# Patient Record
Sex: Female | Born: 1980 | Race: Asian | Hispanic: No | Marital: Married | Smoking: Never smoker
Health system: Southern US, Community
[De-identification: ages and names within clinical notes are randomized; demographics above are authoritative.]

## PROBLEM LIST (undated history)

## (undated) DIAGNOSIS — Z789 Other specified health status: Secondary | ICD-10-CM

## (undated) HISTORY — PX: NO PAST SURGERIES: SHX2092

---

## 2005-11-15 ENCOUNTER — Other Ambulatory Visit: Admission: RE | Admit: 2005-11-15 | Discharge: 2005-11-15 | Payer: Self-pay | Admitting: Gynecology

## 2006-04-29 ENCOUNTER — Emergency Department (HOSPITAL_COMMUNITY): Admission: EM | Admit: 2006-04-29 | Discharge: 2006-04-29 | Payer: Self-pay | Admitting: Emergency Medicine

## 2006-04-30 ENCOUNTER — Emergency Department (HOSPITAL_COMMUNITY): Admission: EM | Admit: 2006-04-30 | Discharge: 2006-04-30 | Payer: Self-pay | Admitting: Emergency Medicine

## 2006-07-09 ENCOUNTER — Inpatient Hospital Stay (HOSPITAL_COMMUNITY): Admission: AD | Admit: 2006-07-09 | Discharge: 2006-07-12 | Payer: Self-pay | Admitting: Obstetrics & Gynecology

## 2007-09-29 ENCOUNTER — Inpatient Hospital Stay (HOSPITAL_COMMUNITY): Admission: AD | Admit: 2007-09-29 | Discharge: 2007-09-29 | Payer: Self-pay | Admitting: Obstetrics and Gynecology

## 2007-10-14 ENCOUNTER — Inpatient Hospital Stay (HOSPITAL_COMMUNITY): Admission: AD | Admit: 2007-10-14 | Discharge: 2007-10-16 | Payer: Self-pay | Admitting: Obstetrics and Gynecology

## 2010-05-14 ENCOUNTER — Ambulatory Visit (HOSPITAL_COMMUNITY)
Admission: AD | Admit: 2010-05-14 | Discharge: 2010-05-14 | Disposition: A | Discharge status: Home / Self Care | Payer: Self-pay | Source: Ambulatory Visit | Attending: Obstetrics and Gynecology | Admitting: Obstetrics and Gynecology

## 2010-05-14 ENCOUNTER — Other Ambulatory Visit: Payer: Self-pay | Admitting: Obstetrics and Gynecology

## 2010-05-14 DIAGNOSIS — O021 Missed abortion: Secondary | ICD-10-CM | POA: Insufficient documentation

## 2010-05-14 LAB — CBC
HCT: 40.9 % (ref 36.0–46.0)
Hemoglobin: 13.6 g/dL (ref 12.0–15.0)
MCV: 89.7 fL (ref 78.0–100.0)

## 2010-05-31 NOTE — Op Note (Signed)
  Anna Sims, DECOURSEY                 ACCOUNT NO.:  [id number]  MEDICAL RECORD NO.:  [id number]           PATIENT TYPE:  O  LOCATION:  [facility]                          [facility]  PHYSICIAN:  Carrington Clamp, M.D. DATE OF BIRTH:  01/12/1981  DATE OF PROCEDURE:  05/14/2010 DATE OF DISCHARGE:                              OPERATIVE REPORT   PREOPERATIVE DIAGNOSIS:  Missed abortion.  POSTOPERATIVE DIAGNOSIS:  Missed abortion.  PROCEDURE:  Dilation and evacuation.  SURGEON:  Carrington Clamp, M.D.  ANESTHESIA:  General LMA.  FINDINGS:  About a 7-week sized uterus down to 6-week size postop with good crie.  SPECIMENS:  Uterine contents to Pathology.  IV FLUIDS:  600 mL.  ESTIMATED BLOOD LOSS:  Minimal.  URINE OUTPUT:  Not measured.  COMPLICATIONS:  None.  TECHNIQUE:  After adequate LMA anesthesia was achieved, the patient was prepped and draped in usual sterile fashion in dorsal lithotomy position.  The bladder was emptied with a red rubber catheter and the speculum placed in the vagina.  Single-tooth tenaculum was placed in the cervix and the cervix dilated with Shawnie Pons dilators.  The 8 mm curette was then passed into the uterus and suction curettage was performed. Alternating suction and sharp curettage was then performed to ensure all products were removed and there was good crie.  Once this was achieved, all instruments were withdrawn from the vagina and the patient was given Methergine.  The patient was transferred to the recovery room in stable condition.    Carrington Clamp, M.D.    MH/MEDQ  D:  05/14/2010  T:  05/15/2010  Job:  [id number]  Electronically Signed by Carrington Clamp MD on 05/31/2010 08:24:02 AM

## 2010-12-23 LAB — URINALYSIS, ROUTINE W REFLEX MICROSCOPIC
Urobilinogen, UA: 0.2
pH: 7

## 2010-12-23 LAB — URINE MICROSCOPIC-ADD ON

## 2010-12-24 LAB — CBC
HCT: 31.6 — ABNORMAL LOW
HCT: 37.9
Hemoglobin: 10.7 — ABNORMAL LOW
MCV: 92
Platelets: 169
RBC: 4.12
RDW: 15.4
RDW: 15.6 — ABNORMAL HIGH

## 2011-03-30 ENCOUNTER — Ambulatory Visit: Payer: Self-pay

## 2011-03-30 DIAGNOSIS — J40 Bronchitis, not specified as acute or chronic: Secondary | ICD-10-CM

## 2011-03-30 DIAGNOSIS — G4700 Insomnia, unspecified: Secondary | ICD-10-CM

## 2011-04-04 ENCOUNTER — Ambulatory Visit (INDEPENDENT_AMBULATORY_CARE_PROVIDER_SITE_OTHER): Payer: Self-pay

## 2011-04-04 DIAGNOSIS — H66009 Acute suppurative otitis media without spontaneous rupture of ear drum, unspecified ear: Secondary | ICD-10-CM

## 2011-12-21 ENCOUNTER — Other Ambulatory Visit: Payer: Self-pay | Admitting: Obstetrics and Gynecology

## 2011-12-21 LAB — OB RESULTS CONSOLE GC/CHLAMYDIA
Chlamydia: NEGATIVE
Gonorrhea: NEGATIVE

## 2012-01-18 LAB — OB RESULTS CONSOLE HEPATITIS B SURFACE ANTIGEN: Hepatitis B Surface Ag: NEGATIVE

## 2012-01-18 LAB — OB RESULTS CONSOLE ANTIBODY SCREEN: Antibody Screen: NEGATIVE

## 2012-03-28 NOTE — L&D Delivery Note (Addendum)
Patient was [name] and pushed for 5 minutes with epidural.   NSVD  female infant, Apgars 9,9, weight P.   The patient had one midline episiotomy cut for terminal bradycardia repaired with 2-0 vicryl R. Fundus was firm. EBL was expected. Placenta was delivered intact. Vagina was clear.  Baby was vigorous to bedside.  Anna Sims

## 2012-07-15 LAB — OB RESULTS CONSOLE GBS: GBS: NEGATIVE

## 2012-08-07 ENCOUNTER — Observation Stay (HOSPITAL_COMMUNITY)
Admission: AD | Admit: 2012-08-07 | Discharge: 2012-08-07 | DRG: 382 | Disposition: A | Discharge status: Home / Self Care | Payer: BC Managed Care – PPO | Source: Ambulatory Visit | Attending: Obstetrics and Gynecology | Admitting: Obstetrics and Gynecology

## 2012-08-07 ENCOUNTER — Encounter (HOSPITAL_COMMUNITY): Payer: Self-pay | Admitting: *Deleted

## 2012-08-07 DIAGNOSIS — O479 False labor, unspecified: Principal | ICD-10-CM | POA: Diagnosis present

## 2012-08-07 HISTORY — DX: Other specified health status: Z78.9

## 2012-08-07 LAB — CBC
MCHC: 33.7 g/dL (ref 30.0–36.0)
Platelets: 169 10*3/uL (ref 150–400)
RDW: 14.3 % (ref 11.5–15.5)

## 2012-08-07 LAB — RPR: RPR Ser Ql: NONREACTIVE

## 2012-08-07 MED ORDER — LACTATED RINGERS IV SOLN: 500.0000 mL | INTRAVENOUS | Status: DC | PRN

## 2012-08-07 MED ORDER — PHENYLEPHRINE 40 MCG/ML (10ML) SYRINGE FOR IV PUSH (FOR BLOOD PRESSURE SUPPORT): 80.0000 ug | PREFILLED_SYRINGE | INTRAVENOUS | Status: DC | PRN

## 2012-08-07 MED ORDER — LACTATED RINGERS IV SOLN: INTRAVENOUS | Status: DC

## 2012-08-07 MED ORDER — CITRIC ACID-SODIUM CITRATE 334-500 MG/5ML PO SOLN: 30.0000 mL | ORAL | Status: DC | PRN

## 2012-08-07 MED ORDER — OXYTOCIN 40 UNITS IN LACTATED RINGERS INFUSION - SIMPLE MED: 62.5000 mL/h | INTRAVENOUS | Status: DC

## 2012-08-07 MED ORDER — FENTANYL 2.5 MCG/ML BUPIVACAINE 1/10 % EPIDURAL INFUSION (WH - ANES): 14.0000 mL/h | INTRAMUSCULAR | Status: DC | PRN

## 2012-08-07 MED ORDER — ONDANSETRON HCL 4 MG/2ML IJ SOLN: 4.0000 mg | Freq: Four times a day (QID) | INTRAMUSCULAR | Status: DC | PRN

## 2012-08-07 MED ORDER — LACTATED RINGERS IV SOLN: 500.0000 mL | Freq: Once | INTRAVENOUS | Status: DC

## 2012-08-07 MED ORDER — ACETAMINOPHEN 325 MG PO TABS: 650.0000 mg | ORAL_TABLET | ORAL | Status: DC | PRN

## 2012-08-07 MED ORDER — FLEET ENEMA 7-19 GM/118ML RE ENEM: 1.0000 | ENEMA | RECTAL | Status: DC | PRN

## 2012-08-07 MED ORDER — OXYTOCIN BOLUS FROM INFUSION: 500.0000 mL | INTRAVENOUS | Status: DC

## 2012-08-07 MED ORDER — DIPHENHYDRAMINE HCL 50 MG/ML IJ SOLN: 12.5000 mg | INTRAMUSCULAR | Status: DC | PRN

## 2012-08-07 MED ORDER — LIDOCAINE HCL (PF) 1 % IJ SOLN: 30.0000 mL | INTRAMUSCULAR | Status: DC | PRN

## 2012-08-07 MED ORDER — EPHEDRINE 5 MG/ML INJ: 10.0000 mg | INTRAVENOUS | Status: DC | PRN

## 2012-08-07 MED ORDER — IBUPROFEN 600 MG PO TABS: 600.0000 mg | ORAL_TABLET | Freq: Four times a day (QID) | ORAL | Status: DC | PRN

## 2012-08-07 MED ORDER — OXYCODONE-ACETAMINOPHEN 5-325 MG PO TABS: 1.0000 | ORAL_TABLET | ORAL | Status: DC | PRN

## 2012-08-07 NOTE — MAU Note (Signed)
Was 5 cm this morning when checked in office. Now having bleeding.  3rd baby.

## 2012-08-07 NOTE — Progress Notes (Signed)
Patient checked in office and 5cm, had some bloody discharge and came in to MAu to be evaluated. Upon arrival she was checked and found to be 6cm but was not contracting frequently.  She was rechecked approx 2hrs after that exam by RN who described cervix as 5cm, stretchable to 6cm, still with thickness and determined that the patient was unchanged.  Baby has been reactive and ctx have spaced to one every 15-71min.  Options of staying with pitocin augmentation or d/c home to continue early labor there were discussed with the patient.  Her decision is to go home.

## 2012-08-09 ENCOUNTER — Inpatient Hospital Stay (HOSPITAL_COMMUNITY)
Admission: AD | Admit: 2012-08-09 | Discharge: 2012-08-11 | DRG: 373 | Disposition: A | Discharge status: Home / Self Care | Payer: BC Managed Care – PPO | Source: Ambulatory Visit | Attending: Obstetrics and Gynecology | Admitting: Obstetrics and Gynecology

## 2012-08-09 ENCOUNTER — Encounter (HOSPITAL_COMMUNITY): Payer: Self-pay | Admitting: Anesthesiology

## 2012-08-09 ENCOUNTER — Encounter (HOSPITAL_COMMUNITY): Payer: Self-pay | Admitting: *Deleted

## 2012-08-09 ENCOUNTER — Inpatient Hospital Stay (HOSPITAL_COMMUNITY): Payer: BC Managed Care – PPO | Admitting: Anesthesiology

## 2012-08-09 DIAGNOSIS — O480 Post-term pregnancy: Principal | ICD-10-CM | POA: Diagnosis present

## 2012-08-09 LAB — CBC
Platelets: 156 10*3/uL (ref 150–400)
RDW: 14.3 % (ref 11.5–15.5)
WBC: 10.8 10*3/uL — ABNORMAL HIGH (ref 4.0–10.5)

## 2012-08-09 LAB — RPR: RPR Ser Ql: NONREACTIVE

## 2012-08-09 LAB — TYPE AND SCREEN
ABO/RH(D): O POS
Antibody Screen: NEGATIVE

## 2012-08-09 MED ORDER — OXYCODONE-ACETAMINOPHEN 5-325 MG PO TABS: 1.0000 | ORAL_TABLET | ORAL | Status: DC | PRN

## 2012-08-09 MED ORDER — DIPHENHYDRAMINE HCL 50 MG/ML IJ SOLN: 12.5000 mg | INTRAMUSCULAR | Status: DC | PRN

## 2012-08-09 MED ORDER — METHYLERGONOVINE MALEATE 0.2 MG/ML IJ SOLN: 0.2000 mg | INTRAMUSCULAR | Status: DC | PRN

## 2012-08-09 MED ORDER — FAMOTIDINE 20 MG PO TABS
20.0000 mg | ORAL_TABLET | Freq: Two times a day (BID) | ORAL | Status: DC
  Administered 2012-08-09 – 2012-08-11 (×4): 20 mg via ORAL
  Filled 2012-08-09 (×4): qty 1

## 2012-08-09 MED ORDER — LACTATED RINGERS IV SOLN: 500.0000 mL | INTRAVENOUS | Status: DC | PRN

## 2012-08-09 MED ORDER — OXYTOCIN BOLUS FROM INFUSION: 500.0000 mL | INTRAVENOUS | Status: DC

## 2012-08-09 MED ORDER — ONDANSETRON HCL 4 MG/2ML IJ SOLN: 4.0000 mg | Freq: Four times a day (QID) | INTRAMUSCULAR | Status: DC | PRN

## 2012-08-09 MED ORDER — WITCH HAZEL-GLYCERIN EX PADS: 1.0000 "application " | MEDICATED_PAD | CUTANEOUS | Status: DC | PRN

## 2012-08-09 MED ORDER — PHENYLEPHRINE 40 MCG/ML (10ML) SYRINGE FOR IV PUSH (FOR BLOOD PRESSURE SUPPORT)
80.0000 ug | PREFILLED_SYRINGE | INTRAVENOUS | Status: DC | PRN
  Filled 2012-08-09: qty 5

## 2012-08-09 MED ORDER — MAGNESIUM HYDROXIDE 400 MG/5ML PO SUSP: 30.0000 mL | ORAL | Status: DC | PRN

## 2012-08-09 MED ORDER — SODIUM CHLORIDE 0.9 % IJ SOLN: 3.0000 mL | INTRAMUSCULAR | Status: DC | PRN

## 2012-08-09 MED ORDER — EPHEDRINE 5 MG/ML INJ: 10.0000 mg | INTRAVENOUS | Status: DC | PRN

## 2012-08-09 MED ORDER — SENNOSIDES-DOCUSATE SODIUM 8.6-50 MG PO TABS
2.0000 | ORAL_TABLET | Freq: Every day | ORAL | Status: DC
  Administered 2012-08-09 – 2012-08-10 (×2): 2 via ORAL

## 2012-08-09 MED ORDER — ACETAMINOPHEN 325 MG PO TABS: 650.0000 mg | ORAL_TABLET | ORAL | Status: DC | PRN

## 2012-08-09 MED ORDER — FENTANYL 2.5 MCG/ML BUPIVACAINE 1/10 % EPIDURAL INFUSION (WH - ANES)
INTRAMUSCULAR | Status: DC | PRN
  Administered 2012-08-09: 12 mL/h via EPIDURAL

## 2012-08-09 MED ORDER — ZOLPIDEM TARTRATE 5 MG PO TABS: 5.0000 mg | ORAL_TABLET | Freq: Every evening | ORAL | Status: DC | PRN

## 2012-08-09 MED ORDER — MEASLES, MUMPS & RUBELLA VAC ~~LOC~~ INJ: 0.5000 mL | INJECTION | Freq: Once | SUBCUTANEOUS | Status: DC

## 2012-08-09 MED ORDER — OXYTOCIN 40 UNITS IN LACTATED RINGERS INFUSION - SIMPLE MED
1.0000 m[IU]/min | INTRAVENOUS | Status: DC
  Administered 2012-08-09: 2 m[IU]/min via INTRAVENOUS
  Filled 2012-08-09: qty 1000

## 2012-08-09 MED ORDER — EPHEDRINE 5 MG/ML INJ
10.0000 mg | INTRAVENOUS | Status: DC | PRN
  Filled 2012-08-09: qty 4

## 2012-08-09 MED ORDER — SODIUM CHLORIDE 0.9 % IJ SOLN: 3.0000 mL | Freq: Two times a day (BID) | INTRAMUSCULAR | Status: DC

## 2012-08-09 MED ORDER — SODIUM CHLORIDE 0.9 % IV SOLN: 250.0000 mL | INTRAVENOUS | Status: DC | PRN

## 2012-08-09 MED ORDER — PHENYLEPHRINE 40 MCG/ML (10ML) SYRINGE FOR IV PUSH (FOR BLOOD PRESSURE SUPPORT): 80.0000 ug | PREFILLED_SYRINGE | INTRAVENOUS | Status: DC | PRN

## 2012-08-09 MED ORDER — TERBUTALINE SULFATE 1 MG/ML IJ SOLN: 0.2500 mg | Freq: Once | INTRAMUSCULAR | Status: DC | PRN

## 2012-08-09 MED ORDER — FENTANYL 2.5 MCG/ML BUPIVACAINE 1/10 % EPIDURAL INFUSION (WH - ANES)
14.0000 mL/h | INTRAMUSCULAR | Status: DC | PRN
  Filled 2012-08-09: qty 125

## 2012-08-09 MED ORDER — PRENATAL MULTIVITAMIN CH
1.0000 | ORAL_TABLET | Freq: Every day | ORAL | Status: DC
  Administered 2012-08-10 – 2012-08-11 (×2): 1 via ORAL
  Filled 2012-08-09 (×2): qty 1

## 2012-08-09 MED ORDER — FERROUS SULFATE 325 (65 FE) MG PO TABS
325.0000 mg | ORAL_TABLET | Freq: Two times a day (BID) | ORAL | Status: DC
  Administered 2012-08-10 – 2012-08-11 (×3): 325 mg via ORAL
  Filled 2012-08-09 (×3): qty 1

## 2012-08-09 MED ORDER — LIDOCAINE HCL (PF) 1 % IJ SOLN
INTRAMUSCULAR | Status: DC | PRN
  Administered 2012-08-09: 4 mL
  Administered 2012-08-09: 3 mL

## 2012-08-09 MED ORDER — LIDOCAINE HCL (PF) 1 % IJ SOLN
30.0000 mL | INTRAMUSCULAR | Status: DC | PRN
  Filled 2012-08-09: qty 30

## 2012-08-09 MED ORDER — DIBUCAINE 1 % RE OINT: 1.0000 "application " | TOPICAL_OINTMENT | RECTAL | Status: DC | PRN

## 2012-08-09 MED ORDER — FLEET ENEMA 7-19 GM/118ML RE ENEM: 1.0000 | ENEMA | RECTAL | Status: DC | PRN

## 2012-08-09 MED ORDER — METHYLERGONOVINE MALEATE 0.2 MG PO TABS: 0.2000 mg | ORAL_TABLET | ORAL | Status: DC | PRN

## 2012-08-09 MED ORDER — IBUPROFEN 800 MG PO TABS
800.0000 mg | ORAL_TABLET | Freq: Three times a day (TID) | ORAL | Status: DC
  Administered 2012-08-09 – 2012-08-11 (×5): 800 mg via ORAL
  Filled 2012-08-09 (×5): qty 1

## 2012-08-09 MED ORDER — DIPHENHYDRAMINE HCL 25 MG PO CAPS: 25.0000 mg | ORAL_CAPSULE | Freq: Four times a day (QID) | ORAL | Status: DC | PRN

## 2012-08-09 MED ORDER — TETANUS-DIPHTH-ACELL PERTUSSIS 5-2.5-18.5 LF-MCG/0.5 IM SUSP
0.5000 mL | Freq: Once | INTRAMUSCULAR | Status: AC
  Administered 2012-08-10: 0.5 mL via INTRAMUSCULAR
  Filled 2012-08-09: qty 0.5

## 2012-08-09 MED ORDER — CITRIC ACID-SODIUM CITRATE 334-500 MG/5ML PO SOLN: 30.0000 mL | ORAL | Status: DC | PRN

## 2012-08-09 MED ORDER — LACTATED RINGERS IV SOLN: 500.0000 mL | Freq: Once | INTRAVENOUS | Status: DC

## 2012-08-09 MED ORDER — ONDANSETRON HCL 4 MG PO TABS: 4.0000 mg | ORAL_TABLET | ORAL | Status: DC | PRN

## 2012-08-09 MED ORDER — LORATADINE 10 MG PO TABS
10.0000 mg | ORAL_TABLET | Freq: Every day | ORAL | Status: DC
  Administered 2012-08-09 – 2012-08-10 (×2): 10 mg via ORAL
  Filled 2012-08-09 (×3): qty 1

## 2012-08-09 MED ORDER — ONDANSETRON HCL 4 MG/2ML IJ SOLN: 4.0000 mg | INTRAMUSCULAR | Status: DC | PRN

## 2012-08-09 MED ORDER — SIMETHICONE 80 MG PO CHEW: 80.0000 mg | CHEWABLE_TABLET | ORAL | Status: DC | PRN

## 2012-08-09 MED ORDER — LANOLIN HYDROUS EX OINT: TOPICAL_OINTMENT | CUTANEOUS | Status: DC | PRN

## 2012-08-09 MED ORDER — OXYTOCIN 40 UNITS IN LACTATED RINGERS INFUSION - SIMPLE MED: 62.5000 mL/h | INTRAVENOUS | Status: DC

## 2012-08-09 MED ORDER — LACTATED RINGERS IV SOLN
INTRAVENOUS | Status: DC
  Administered 2012-08-09: 10:00:00 via INTRAVENOUS

## 2012-08-09 MED ORDER — BENZOCAINE-MENTHOL 20-0.5 % EX AERO
1.0000 "application " | INHALATION_SPRAY | CUTANEOUS | Status: DC | PRN
  Administered 2012-08-11: 1 via TOPICAL
  Filled 2012-08-09 (×3): qty 56

## 2012-08-09 MED ORDER — IBUPROFEN 600 MG PO TABS: 600.0000 mg | ORAL_TABLET | Freq: Four times a day (QID) | ORAL | Status: DC | PRN

## 2012-08-09 NOTE — Anesthesia Procedure Notes (Signed)
Epidural Patient location during procedure: OB Start time: 08/09/2012 1:40 PM  Staffing Anesthesiologist: Leighanne Adolph A. Performed by: anesthesiologist   Preanesthetic Checklist Completed: patient identified, site marked, surgical consent, pre-op evaluation, timeout performed, IV checked, risks and benefits discussed and monitors and equipment checked  Epidural Patient position: sitting Prep: site prepped and draped and DuraPrep Patient monitoring: continuous pulse ox and blood pressure Approach: midline Injection technique: LOR air  Needle:  Needle type: Tuohy  Needle gauge: 17 G Needle length: 9 cm and 9 Needle insertion depth: 5 cm cm Catheter type: closed end flexible Catheter size: 19 Gauge Catheter at skin depth: 10 cm Test dose: negative and Other  Assessment Events: blood not aspirated, injection not painful, no injection resistance, negative IV test and no paresthesia  Additional Notes Patient identified. Risks and benefits discussed including failed block, incomplete  Pain control, post dural puncture headache, nerve damage, paralysis, blood pressure Changes, nausea, vomiting, reactions to medications-both toxic and allergic and post Partum back pain. All questions were answered. Patient expressed understanding and wished to proceed. Sterile technique was used throughout procedure. Epidural site was Dressed with sterile barrier dressing. No paresthesias, signs of intravascular injection Or signs of intrathecal spread were encountered.  Patient was more comfortable after the epidural was dosed. Please see RN's note for documentation of vital signs and FHR which are stable.

## 2012-08-09 NOTE — H&P (Signed)
32 y.o. [redacted]w[redacted]d  [id number] comes in for induction post dates.  Otherwise has good fetal movement and no bleeding.  Past Medical History  Diagnosis Date  . Medical history non-contributory     Past Surgical History  Procedure Laterality Date  . No past surgeries      OB History   Grav Para Term Preterm Abortions TAB SAB Ect Mult Living   4 2 2  1  1   2      # Outc Date GA Lbr Len/2nd Wgt Sex Del Anes PTL Lv   1 TRM            2 TRM            3 SAB            4 CUR               History   Social History  . Marital Status: Married    Spouse Name: N/A    Number of Children: N/A  . Years of Education: N/A   Occupational History  . Not on file.   Social History Main Topics  . Smoking status: Never Smoker   . Smokeless tobacco: Never Used  . Alcohol Use: No  . Drug Use: No  . Sexually Active: Not on file   Other Topics Concern  . Not on file   Social History Narrative  . No narrative on file   Review of patient's allergies indicates no known allergies.    Prenatal Transfer Tool  Maternal Diabetes: No Genetic Screening: Normal Maternal Ultrasounds/Referrals: Normal Fetal Ultrasounds or other Referrals:  None Maternal Substance Abuse:  No Significant Maternal Medications:  None Significant Maternal Lab Results: None  Other AVW:[id number].    Filed Vitals:   08/09/12 1328  BP:   Pulse: 80  Temp:   Resp:      Lungs/Cor:  NAD Abdomen:  soft, gravid Ex:  no cords, erythema SVE:  6/70/-2, AROM clear FHTs:  120, good STV, NST R Toco:  q3   A/P   Post dates induction.  GBS neg.  Mar Walmer A

## 2012-08-09 NOTE — Anesthesia Preprocedure Evaluation (Signed)
Anesthesia Evaluation  Patient identified by MRN, date of birth, ID band Patient awake    Reviewed: Allergy & Precautions, H&P , Patient's Chart, lab work & pertinent test results  Airway Mallampati: III TM Distance: >3 FB Neck ROM: full    Dental no notable dental hx. (+) Teeth Intact   Pulmonary neg pulmonary ROS,  breath sounds clear to auscultation  Pulmonary exam normal       Cardiovascular negative cardio ROS  Rhythm:regular Rate:Normal     Neuro/Psych negative neurological ROS  negative psych ROS   GI/Hepatic Neg liver ROS, GERD-  Medicated and Controlled,  Endo/Other  negative endocrine ROS  Renal/GU negative Renal ROS  negative genitourinary   Musculoskeletal   Abdominal Normal abdominal exam  (+)   Peds  Hematology negative hematology ROS (+)   Anesthesia Other Findings   Reproductive/Obstetrics (+) Pregnancy                          Anesthesia Physical Anesthesia Plan  ASA: II  Anesthesia Plan: Epidural   Post-op Pain Management:    Induction:   Airway Management Planned:   Additional Equipment:   Intra-op Plan:   Post-operative Plan:   Informed Consent: I have reviewed the patients History and Physical, chart, labs and discussed the procedure including the risks, benefits and alternatives for the proposed anesthesia with the patient or authorized representative who has indicated his/her understanding and acceptance.     Plan Discussed with: Anesthesiologist  Anesthesia Plan Comments:         Anesthesia Quick Evaluation   

## 2012-08-10 LAB — CBC
Hemoglobin: 10.7 g/dL — ABNORMAL LOW (ref 12.0–15.0)
RBC: 3.54 MIL/uL — ABNORMAL LOW (ref 3.87–5.11)
WBC: 15 10*3/uL — ABNORMAL HIGH (ref 4.0–10.5)

## 2012-08-10 NOTE — Progress Notes (Signed)
Patient is eating, ambulating, voiding.  Pain control is good. Appropriate lochia.  No complaints.  Filed Vitals:   08/09/12 1850 08/09/12 1950 08/09/12 2330 08/10/12 0633  BP: 113/76 117/74 104/67 106/67  Pulse: 76 93 100 77  Temp: 97.7 F (36.5 C) 98.9 F (37.2 C) 98.2 F (36.8 C) 98.4 F (36.9 C)  TempSrc: Oral Oral Oral Oral  Resp: 16 18 18 18   Height:      Weight:      SpO2:    99%    Fundus firm Perineum without swelling. No CT  Lab Results  Component Value Date   WBC 15.0* 08/10/2012   HGB 10.7* 08/10/2012   HCT 32.3* 08/10/2012   MCV 91.2 08/10/2012   PLT 141* 08/10/2012    --/--/O POS, O POS (05/15 1020)  A/P Post partum day 1. Plans to have circ done by same physician who did last baby (peds? Per FOB).  Advised them to call to schedule and if there is any problem let nurse know and I can do later today.  Consent obtained prophylactically.  Routine care.  Expect d/c 5/17  [place], [place]

## 2012-08-10 NOTE — Discharge Summary (Signed)
Please see progress note from 08/07/12.  Pt not in labor.  Was discharged home.

## 2012-08-10 NOTE — Anesthesia Postprocedure Evaluation (Signed)
  Anesthesia Post-op Note  Patient: Anna Sims  Procedure(s) Performed: * No procedures listed *  Patient Location: PACU and Mother/Baby  Anesthesia Type:Epidural  Level of Consciousness: awake, alert  and oriented  Airway and Oxygen Therapy: Patient Spontanous Breathing  Post-op Pain: mild  Post-op Assessment: Patient's Cardiovascular Status Stable, Respiratory Function Stable, No signs of Nausea or vomiting, Adequate PO intake, Pain level controlled, No headache, No backache, No residual numbness and No residual motor weakness  Post-op Vital Signs: stable  Complications: No apparent anesthesia complications

## 2012-08-10 NOTE — H&P (Signed)
Pt presented from office 5cm dilated.  Please see Progress note from 08/07/12  Past Medical History  Diagnosis Date  . Medical history non-contributory     Past Surgical History  Procedure Laterality Date  . No past surgeries      OB History   Grav Para Term Preterm Abortions TAB SAB Ect Mult Living   4 3 3  1  1   3      # Outc Date GA Lbr Len/2nd Wgt Sex Del Anes PTL Lv   1 TRM 5/14 [redacted]w[redacted]d 02:58 / 00:06 1.610RU([id number]) M SVD EPI  Yes   2 TRM            3 TRM            4 SAB               History   Social History  . Marital Status: Married    Spouse Name: N/A    Number of Children: N/A  . Years of Education: N/A   Occupational History  . Not on file.   Social History Main Topics  . Smoking status: Never Smoker   . Smokeless tobacco: Never Used  . Alcohol Use: No  . Drug Use: No  . Sexually Active: Not on file   Other Topics Concern  . Not on file   Social History Narrative  . No narrative on file   Review of patient's allergies indicates no known allergies.      Filed Vitals:   08/07/12 1806  BP: 100/63  Pulse: 81  Temp:   Resp:      Lungs/Cor:  NAD Abdomen:  soft, gravid Ex:  no cords, erythema SVE:  6cm FHTs:  NST R Toco:  q 15-20   A/P   Patient transferred to L&D for further evaluation.  Was not contracting frequently.  Please see note from 08/07/12    Philip Aspen

## 2012-08-11 NOTE — Discharge Summary (Signed)
Obstetric Discharge Summary Reason for Admission: induction of labor Prenatal Procedures: none Intrapartum Procedures: spontaneous vaginal delivery and episiotomy midline Postpartum Procedures: none Complications-Operative and Postpartum: none Hemoglobin  Date Value Range Status  08/10/2012 10.7* 12.0 - 15.0 g/dL Final     HCT  Date Value Range Status  08/10/2012 32.3* 36.0 - 46.0 % Final    Physical Exam:  General: alert and cooperative Lochia: appropriate Uterine Fundus: firm DVT Evaluation: No evidence of DVT seen on physical exam.  Discharge Diagnoses: Term Pregnancy-delivered  Discharge Information: Date: 08/11/2012 Activity: pelvic rest Diet: routine Medications: PNV and Ibuprofen Condition: stable Instructions: refer to practice specific booklet Discharge to: home Follow-up Information   Follow up with HORVATH,MICHELLE A, MD In 4 weeks.   Contact information:   [address] [place] [address] [phone]       Newborn Data: Live born female  Birth Weight: 8 lb 5 oz (3771 g) APGAR: 9, 9  Home with mother.  Philip Aspen 08/11/2012, 10:31 AM

## 2012-08-13 NOTE — Progress Notes (Signed)
Ur chart review completed.  

## 2012-08-14 ENCOUNTER — Ambulatory Visit (HOSPITAL_COMMUNITY): Admit: 2012-08-14 | Payer: BC Managed Care – PPO

## 2014-01-27 ENCOUNTER — Encounter (HOSPITAL_COMMUNITY): Payer: Self-pay | Admitting: *Deleted

## 2016-07-19 ENCOUNTER — Encounter (HOSPITAL_BASED_OUTPATIENT_CLINIC_OR_DEPARTMENT_OTHER): Payer: Self-pay

## 2016-07-19 ENCOUNTER — Emergency Department (HOSPITAL_BASED_OUTPATIENT_CLINIC_OR_DEPARTMENT_OTHER)
Admission: EM | Admit: 2016-07-19 | Discharge: 2016-07-20 | Disposition: A | Discharge status: Home / Self Care | Payer: BLUE CROSS/BLUE SHIELD | Attending: Emergency Medicine | Admitting: Emergency Medicine

## 2016-07-19 DIAGNOSIS — R319 Hematuria, unspecified: Secondary | ICD-10-CM

## 2016-07-19 DIAGNOSIS — N3091 Cystitis, unspecified with hematuria: Secondary | ICD-10-CM | POA: Diagnosis not present

## 2016-07-19 DIAGNOSIS — R1030 Lower abdominal pain, unspecified: Secondary | ICD-10-CM | POA: Diagnosis present

## 2016-07-19 LAB — PREGNANCY, URINE: Preg Test, Ur: NEGATIVE

## 2016-07-19 LAB — URINALYSIS, ROUTINE W REFLEX MICROSCOPIC
Bilirubin Urine: NEGATIVE
Glucose, UA: NEGATIVE mg/dL
KETONES UR: 15 mg/dL — AB
NITRITE: NEGATIVE
PH: 8 (ref 5.0–8.0)
PROTEIN: 100 mg/dL — AB
Specific Gravity, Urine: 1.008 (ref 1.005–1.030)

## 2016-07-19 LAB — URINALYSIS, MICROSCOPIC (REFLEX)

## 2016-07-19 NOTE — ED Triage Notes (Signed)
Pt c/o left flank pain with hematuria and burning with urination

## 2016-07-19 NOTE — ED Provider Notes (Signed)
[facility] Provider Note   CSN: [id number] Arrival date & time: 07/19/16  2302  By signing my name below, I, Rosario Adie, attest that this documentation has been prepared under the direction and in the presence of Taqwa Deem, MD. Electronically Signed: Rosario Adie, ED Scribe. 07/20/16. 12:05 AM.  History   Chief Complaint Chief Complaint  Patient presents with  . Flank Pain   The history is provided by the patient. No language interpreter was used.  Flank Pain  This is a new problem. The current episode started more than 1 week ago. The problem occurs constantly. The problem has been gradually worsening. Pertinent negatives include no chest pain and no shortness of breath. Nothing aggravates the symptoms. Nothing relieves the symptoms. She has tried acetaminophen for the symptoms. The treatment provided no relief.     HPI Comments: Anna Sims is a 36 y.o. female with no pertinent PMHx, who presents to the Emergency Department complaining of persistent, cramping lower back pain with beginning approximately one week ago. She notes associated dysuria and frequency beginning one hour ago. She took Tylenol this morning for her pain without relief. She denies fever, vomiting, diarrhea, or any other associated symptoms. LNMP: 2 weeks ago.   Past Medical History:  Diagnosis Date  . Medical history non-contributory    There are no active problems to display for this patient.  Past Surgical History:  Procedure Laterality Date  . NO PAST SURGERIES     OB History    Gravida Para Term Preterm AB Living   SAB TAB Ectopic Multiple Live Births   1       1     Home Medications    Prior to Admission medications   Medication Sig Start Date End Date Taking? Authorizing Provider  acetaminophen (TYLENOL) 325 MG tablet Take 650 mg by mouth every 6 (six) hours as needed for pain.    Historical Provider, MD  loratadine (CLARITIN) 10 MG tablet Take  10 mg by mouth daily.    Historical Provider, MD  ranitidine (ZANTAC) 150 MG tablet Take 150 mg by mouth 2 (two) times daily.    Historical Provider, MD   Family History Family History  Problem Relation Age of Onset  . Hypertension Mother   . Diabetes Mother    Social History Social History  Substance Use Topics  . Smoking status: Never Smoker  . Smokeless tobacco: Never Used  . Alcohol use No   Allergies   Patient has no known allergies.  Review of Systems Review of Systems  Constitutional: Negative for appetite change, chills and fever.  HENT: Negative for drooling and facial swelling.   Eyes: Negative for photophobia.  Respiratory: Negative for shortness of breath.   Cardiovascular: Negative for chest pain, palpitations and leg swelling.  Gastrointestinal: Negative for anal bleeding, diarrhea and vomiting.  Genitourinary: Positive for dysuria, flank pain and frequency. Negative for difficulty urinating.  Musculoskeletal: Negative for neck stiffness.  Skin: Negative for pallor.  Neurological: Negative for facial asymmetry and speech difficulty.  Psychiatric/Behavioral: Negative for suicidal ideas.  All other systems reviewed and are negative.  Physical Exam Updated Vital Signs BP 112/81 (BP Location: Left Arm)   Pulse 82   Temp 98.2 F (36.8 C) (Oral)   Resp 18   Ht 5' (1.524 m)   Wt 107 lb (48.5 kg)   LMP 07/07/2016   SpO2 100%   BMI 20.90 kg/m  Physical Exam  Constitutional: She appears well-developed and well-nourished.  HENT:  Head: Normocephalic.  Mouth/Throat: Oropharynx is clear and moist. No oropharyngeal exudate.  Eyes: Conjunctivae and EOM are normal. Pupils are equal, round, and reactive to light. Right eye exhibits no discharge. Left eye exhibits no discharge. No scleral icterus.  Neck: Normal range of motion. Neck supple. No JVD present. No tracheal deviation present.  Trachea is midline. No stridor or carotid bruits.   Cardiovascular: Normal  rate, regular rhythm, normal heart sounds and intact distal pulses.   No murmur heard. Pulmonary/Chest: Effort normal and breath sounds normal. No stridor. No respiratory distress. She has no wheezes. She has no rales.  Lungs CTA bilaterally.  Abdominal: Soft. Bowel sounds are normal. She exhibits no distension. There is tenderness (suprapubic). There is no rebound and no guarding.  Musculoskeletal: Normal range of motion. She exhibits no edema or tenderness.  All compartments are soft. No palpable cords.   Lymphadenopathy:    She has no cervical adenopathy.  Neurological: She is alert. She has normal reflexes. She displays normal reflexes. She exhibits normal muscle tone.  Skin: Skin is warm and dry. Capillary refill takes less than 2 seconds.  Psychiatric: She has a normal mood and affect. Her behavior is normal.  Nursing note and vitals reviewed.  ED Treatments / Results  DIAGNOSTIC STUDIES: Oxygen Saturation is 100% on RA, normal by my interpretation.   COORDINATION OF CARE: 12:05 AM-Discussed next steps with pt. Pt verbalized understanding and is agreeable with the plan.   Labs (all labs ordered are listed, but only abnormal results are displayed)  Results for orders placed or performed during the hospital encounter of 07/19/16  Urinalysis, Routine w reflex microscopic  Result Value Ref Range   Color, Urine RED (A) YELLOW   APPearance CLOUDY (A) CLEAR   Specific Gravity, Urine 1.008 1.005 - 1.030   pH 8.0 5.0 - 8.0   Glucose, UA NEGATIVE NEGATIVE mg/dL   Hgb urine dipstick LARGE (A) NEGATIVE   Bilirubin Urine NEGATIVE NEGATIVE   Ketones, ur 15 (A) NEGATIVE mg/dL   Protein, ur 161 (A) NEGATIVE mg/dL   Nitrite NEGATIVE NEGATIVE   Leukocytes, UA LARGE (A) NEGATIVE  Pregnancy, urine  Result Value Ref Range   Preg Test, Ur NEGATIVE NEGATIVE  Urinalysis, Microscopic (reflex)  Result Value Ref Range   RBC / HPF TOO NUMEROUS TO COUNT 0 - 5 RBC/hpf   WBC, UA 6-30 0 - 5  WBC/hpf   Bacteria, UA FEW (A) NONE SEEN   Squamous Epithelial / LPF 0-5 (A) NONE SEEN   Ct Renal Stone Study  Result Date: 07/20/2016 CLINICAL DATA:  Acute onset of left flank pain and hematuria. Dysuria. Initial encounter. EXAM: CT ABDOMEN AND PELVIS WITHOUT CONTRAST TECHNIQUE: Multidetector CT imaging of the abdomen and pelvis was performed following the standard protocol without IV contrast. COMPARISON:  None. FINDINGS: Lower chest: The visualized lung bases are grossly clear. The visualized portions of the mediastinum are unremarkable. Hepatobiliary: The liver is unremarkable in appearance. The gallbladder is unremarkable in appearance. The common bile duct remains normal in caliber. Pancreas: The pancreas is within normal limits. Spleen: The spleen is unremarkable in appearance. Adrenals/Urinary Tract: The adrenal glands are unremarkable in appearance. The kidneys are within normal limits. There is no evidence of hydronephrosis. No renal or ureteral stones are identified. No perinephric stranding is seen. Stomach/Bowel: The stomach is unremarkable in appearance. The small bowel is within normal limits. The appendix is normal in  caliber, without evidence of appendicitis. The colon is unremarkable in appearance. Vascular/Lymphatic: The abdominal aorta is unremarkable in appearance. The inferior vena cava is grossly unremarkable. No retroperitoneal lymphadenopathy is seen. No pelvic sidewall lymphadenopathy is identified. Reproductive: The bladder is mildly distended and grossly unremarkable. The intrauterine device is noted in expected position at the fundus of the uterus. A 3.3 cm right ovarian cyst is noted. The ovaries are otherwise unremarkable. No suspicious adnexal masses are seen. Other: No additional soft tissue abnormalities are seen. Musculoskeletal: No acute osseous abnormalities are identified. The visualized musculature is unremarkable in appearance. IMPRESSION: 1. No acute abnormality seen  within the abdomen or pelvis. 2. 3.3 cm right ovarian cyst is likely physiologic, though pelvic ultrasound could be considered for further evaluation as deemed clinically appropriate, on an elective nonemergent basis. Electronically Signed   By: Roanna Raider M.D.   On: 07/20/2016 01:31    Procedures Procedures   Medications Ordered in ED  Medications  sodium chloride 0.9 % bolus 500 mL (0 mLs Intravenous Stopped 07/20/16 0251)  ketorolac (TORADOL) 30 MG/ML injection 30 mg (30 mg Intravenous Given 07/20/16 0103)  cefTRIAXone (ROCEPHIN) 1 g in dextrose 5 % 50 mL IVPB (0 g Intravenous Stopped 07/20/16 0251)     This is a 36 y.o. -year-old female presents with hemorrhagic cystitis.  The patient is nontoxic-appearing on exam and vital signs are within normal limits.  Take all antibiotics as prescribed.  Follow up with your PMD.  Well appearing. Exam and vitals are benign and reassuring. Return for weakness, numbness,  vomiting or pain or any concerns. Patient is extremely well appearing and exam and imaging are negative for acute finding.   After history, exam, and medical workup I feel the patient has been appropriately medically screened and is safe for discharge home. Pertinent diagnoses were discussed with the patient. Patient was given return precautions.   ew Prescriptions New Prescriptions   No medications on file   I personally performed the services described in this documentation, which was scribed in my presence. The recorded information has been reviewed and is accurate.       Cy Blamer, MD 07/20/16 [id number]

## 2016-07-20 ENCOUNTER — Emergency Department (HOSPITAL_BASED_OUTPATIENT_CLINIC_OR_DEPARTMENT_OTHER): Payer: BLUE CROSS/BLUE SHIELD

## 2016-07-20 ENCOUNTER — Encounter (HOSPITAL_BASED_OUTPATIENT_CLINIC_OR_DEPARTMENT_OTHER): Payer: Self-pay | Admitting: Emergency Medicine

## 2016-07-20 MED ORDER — NITROFURANTOIN MONOHYD MACRO 100 MG PO CAPS: 100.0000 mg | ORAL_CAPSULE | Freq: Two times a day (BID) | ORAL | 0 refills | Status: AC

## 2016-07-20 MED ORDER — SODIUM CHLORIDE 0.9 % IV BOLUS (SEPSIS)
500.0000 mL | Freq: Once | INTRAVENOUS | Status: AC
  Administered 2016-07-20: 500 mL via INTRAVENOUS

## 2016-07-20 MED ORDER — KETOROLAC TROMETHAMINE 30 MG/ML IJ SOLN
30.0000 mg | Freq: Once | INTRAMUSCULAR | Status: AC
  Administered 2016-07-20: 30 mg via INTRAVENOUS
  Filled 2016-07-20: qty 1

## 2016-07-20 MED ORDER — DEXTROSE 5 % IV SOLN
1.0000 g | Freq: Once | INTRAVENOUS | Status: AC
  Administered 2016-07-20: 1 g via INTRAVENOUS
  Filled 2016-07-20: qty 10

## 2018-09-06 IMAGING — CT CT RENAL STONE PROTOCOL
2 of 4 series · 16 of 46 positions shown, 18 images · non-contrast
Comparison: None.

CLINICAL DATA: Acute onset of left flank pain and hematuria.
Dysuria. Initial encounter.

EXAM:
CT ABDOMEN AND PELVIS WITHOUT CONTRAST
TECHNIQUE: Multidetector CT imaging of the abdomen and pelvis was performed
following the standard protocol without IV contrast.

[Series 2: axial st · axial · 0.56mm/px · z∈[-442,-2]mm · 13 of 98 slices shown, 15 images]
[im 5/98  soft-tissue]
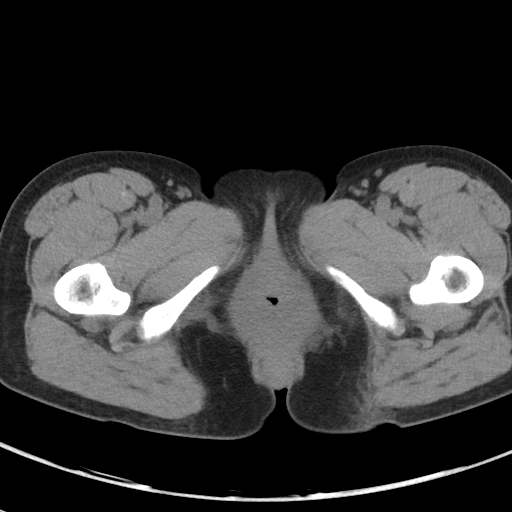
[im 5/98  bone]
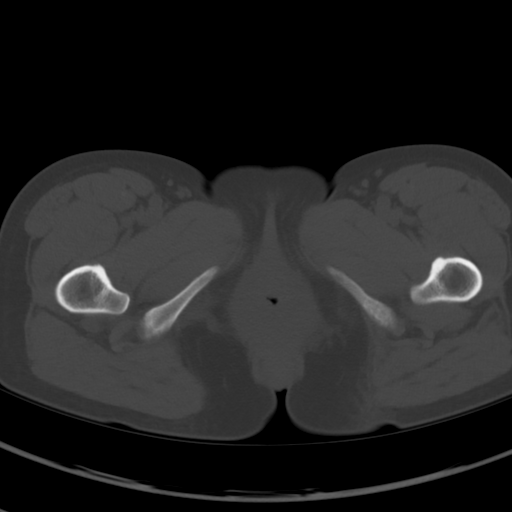
[im 13/98  soft-tissue]
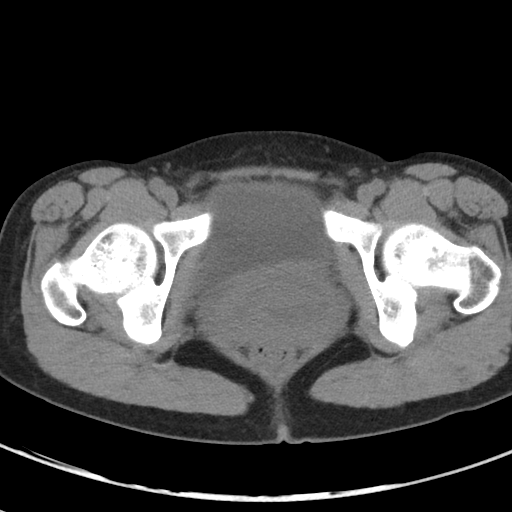
[im 21/98  soft-tissue]
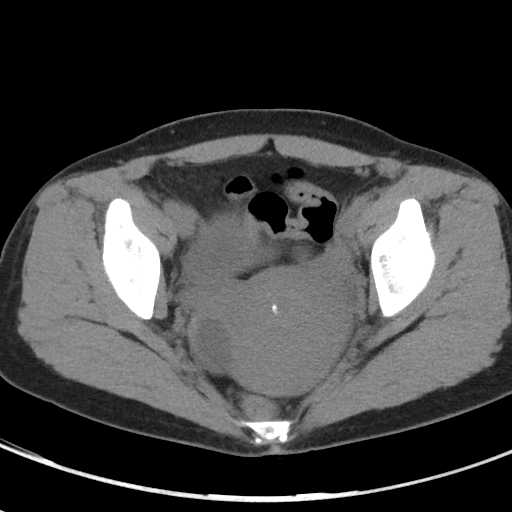
[im 29/98  soft-tissue]
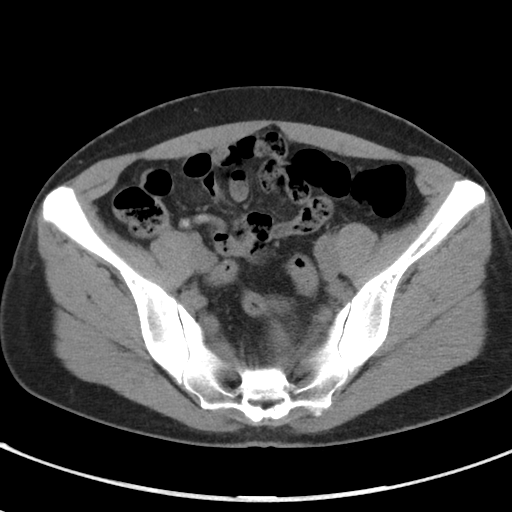
[im 33/98  soft-tissue]
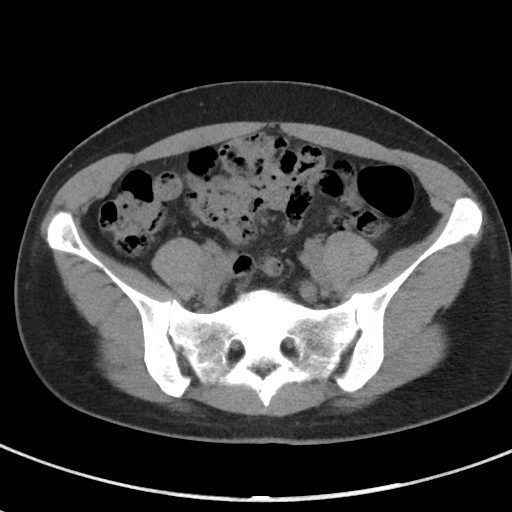
[im 41/98  soft-tissue]
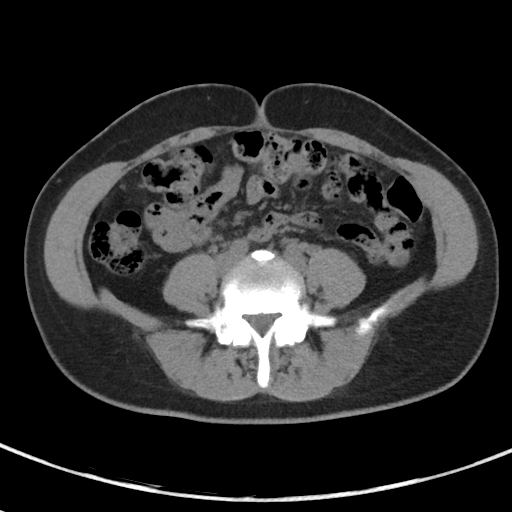
[im 49/98  soft-tissue]
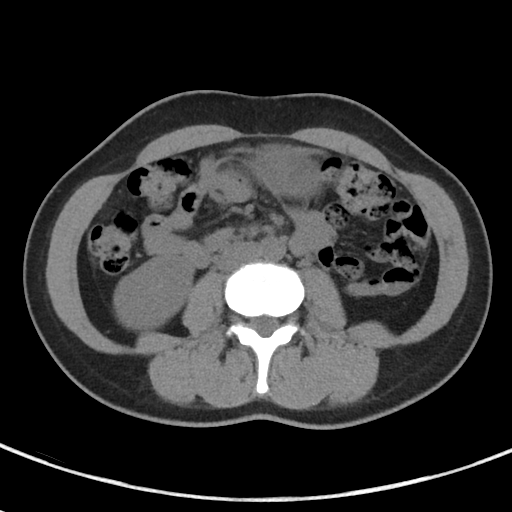
[im 57/98  soft-tissue]
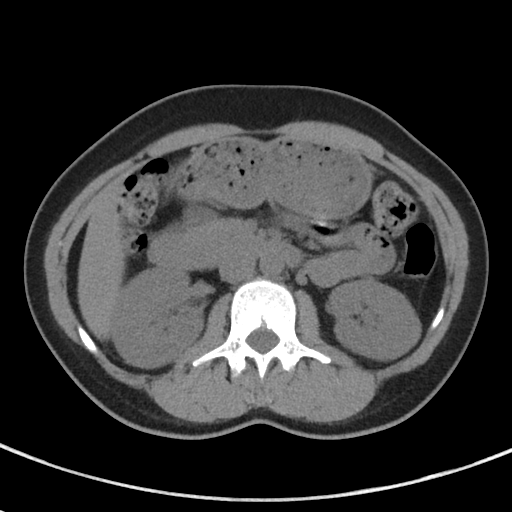
[im 65/98  soft-tissue]
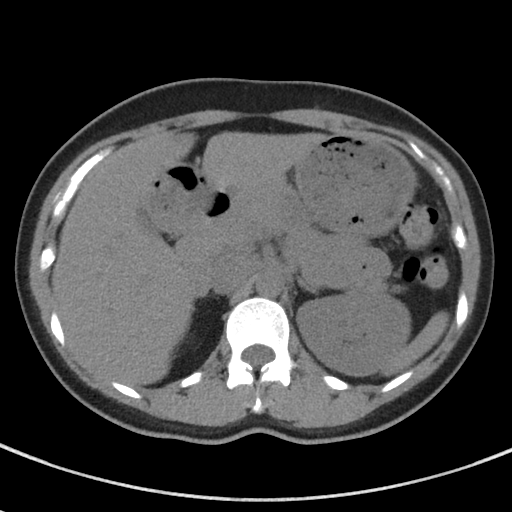
[im 65/98  bone]
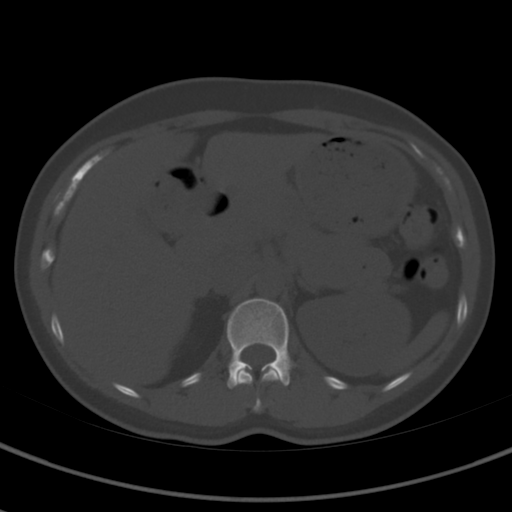
[im 69/98  soft-tissue]
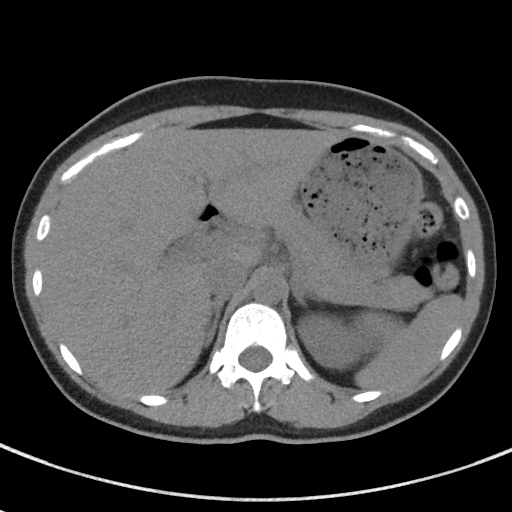
[im 77/98  soft-tissue]
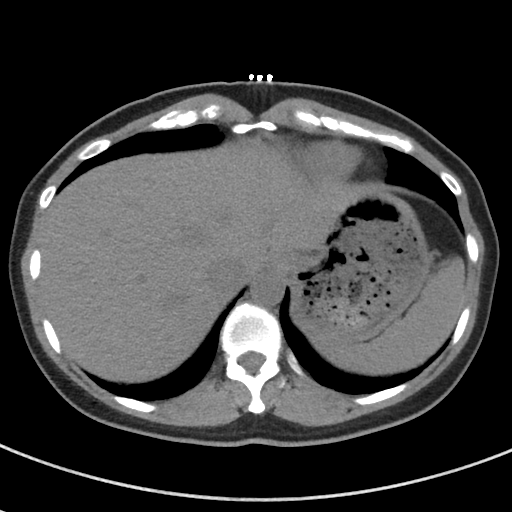
[im 85/98  soft-tissue]
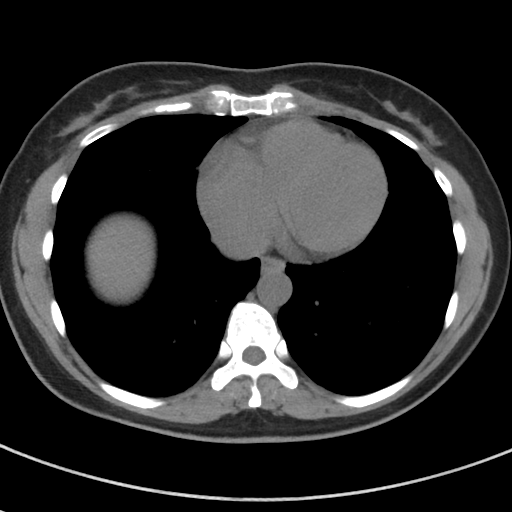
[im 93/98  soft-tissue]
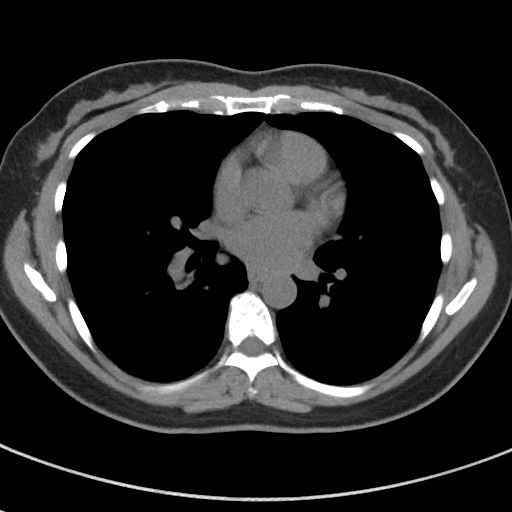

[Series 4: coronal st · coronal · 0.75mm/px · 3 of 65 slices shown]
[im 22/65  soft-tissue]
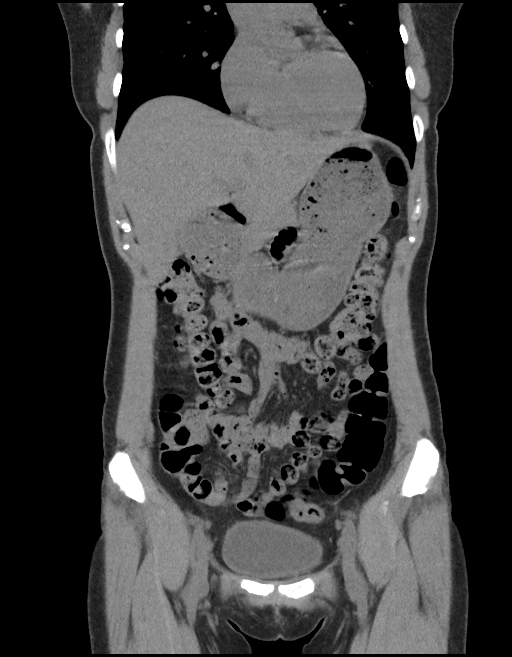
[im 29/65  soft-tissue]
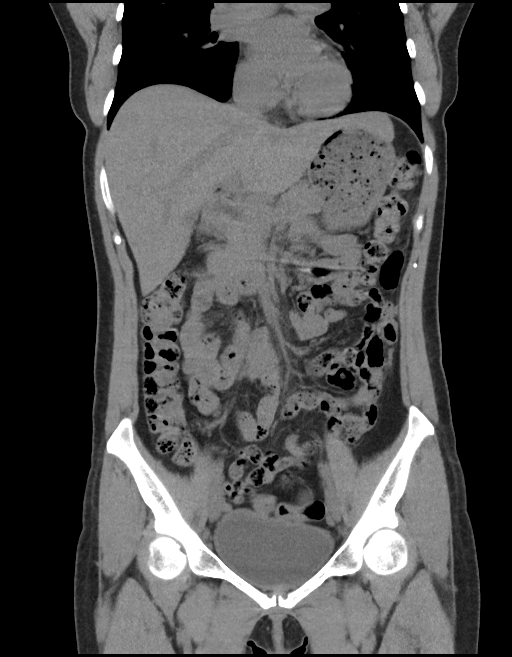
[im 36/65  soft-tissue]
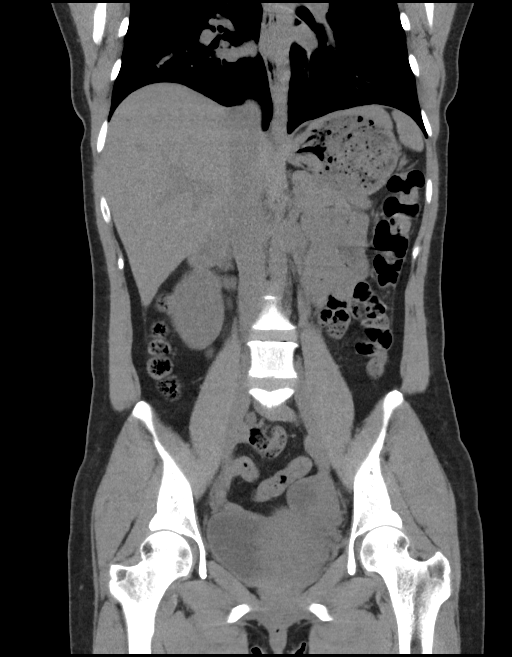

[16 of 46 positions shown; findings below may reference images not displayed]

FINDINGS: Lower chest: The visualized lung bases are grossly clear. The
visualized portions of the mediastinum are unremarkable.

Hepatobiliary: The liver is unremarkable in appearance. The
gallbladder is unremarkable in appearance. The common bile duct
remains normal in caliber.

Pancreas: The pancreas is within normal limits.

Spleen: The spleen is unremarkable in appearance.

Adrenals/Urinary Tract: The adrenal glands are unremarkable in
appearance. The kidneys are within normal limits. There is no
evidence of hydronephrosis. No renal or ureteral stones are
identified. No perinephric stranding is seen.

Stomach/Bowel: The stomach is unremarkable in appearance. The small
bowel is within normal limits. The appendix is normal in caliber,
without evidence of appendicitis. The colon is unremarkable in
appearance.

Vascular/Lymphatic: The abdominal aorta is unremarkable in
appearance. The inferior vena cava is grossly unremarkable. No
retroperitoneal lymphadenopathy is seen. No pelvic sidewall
lymphadenopathy is identified.

Reproductive: The bladder is mildly distended and grossly
unremarkable. The intrauterine device is noted in expected position
at the fundus of the uterus. A 3.3 cm right ovarian cyst is noted.
The ovaries are otherwise unremarkable. No suspicious adnexal masses
are seen.

Other: No additional soft tissue abnormalities are seen.

Musculoskeletal: No acute osseous abnormalities are identified. The
visualized musculature is unremarkable in appearance.
IMPRESSION: 1. No acute abnormality seen within the abdomen or pelvis.
[DATE] cm right ovarian cyst is likely physiologic, though pelvic
ultrasound could be considered for further evaluation as deemed
clinically appropriate, on an elective nonemergent basis.
